# Patient Record
Sex: Female | Born: 2000 | Race: White | Hispanic: No | Marital: Single | Smoking: Never smoker
Health system: Southern US, Community
[De-identification: ages and names within clinical notes are randomized; demographics above are authoritative.]

---
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
## 2019-11-09 ENCOUNTER — Other Ambulatory Visit: Payer: Self-pay

## 2019-11-09 DIAGNOSIS — Z20822 Contact with and (suspected) exposure to covid-19: Secondary | ICD-10-CM

## 2019-11-10 LAB — NOVEL CORONAVIRUS, NAA: SARS-CoV-2, NAA: NOT DETECTED
